# Patient Record
Sex: Male | Born: 2000 | Race: Black or African American | Hispanic: No | Marital: Single | Smoking: Never smoker
Health system: Southern US, Community
[De-identification: ages and names within clinical notes are randomized; demographics above are authoritative.]

## PROBLEM LIST (undated history)

## (undated) DIAGNOSIS — F88 Other disorders of psychological development: Secondary | ICD-10-CM

## (undated) DIAGNOSIS — F809 Developmental disorder of speech and language, unspecified: Secondary | ICD-10-CM

## (undated) DIAGNOSIS — J302 Other seasonal allergic rhinitis: Secondary | ICD-10-CM

## (undated) HISTORY — DX: Other disorders of psychological development: F88

## (undated) HISTORY — DX: Developmental disorder of speech and language, unspecified: F80.9

## (undated) HISTORY — DX: Other seasonal allergic rhinitis: J30.2

---
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
## 2022-11-18 ENCOUNTER — Emergency Department (HOSPITAL_COMMUNITY): Payer: No Typology Code available for payment source

## 2022-11-18 ENCOUNTER — Encounter (HOSPITAL_COMMUNITY): Payer: Self-pay | Admitting: *Deleted

## 2022-11-18 ENCOUNTER — Emergency Department (HOSPITAL_COMMUNITY)
Admission: EM | Admit: 2022-11-18 | Discharge: 2022-11-18 | Disposition: A | Discharge status: Home / Self Care | Payer: No Typology Code available for payment source | Attending: Emergency Medicine | Admitting: Emergency Medicine

## 2022-11-18 ENCOUNTER — Other Ambulatory Visit: Payer: Self-pay

## 2022-11-18 DIAGNOSIS — R451 Restlessness and agitation: Secondary | ICD-10-CM

## 2022-11-18 DIAGNOSIS — F4324 Adjustment disorder with disturbance of conduct: Secondary | ICD-10-CM | POA: Diagnosis not present

## 2022-11-18 DIAGNOSIS — R456 Violent behavior: Secondary | ICD-10-CM | POA: Diagnosis present

## 2022-11-18 LAB — COMPREHENSIVE METABOLIC PANEL
ALT: 23 U/L (ref 0–44)
AST: 25 U/L (ref 15–41)
Albumin: 4.5 g/dL (ref 3.5–5.0)
Alkaline Phosphatase: 48 U/L (ref 38–126)
Anion gap: 7 (ref 5–15)
BUN: 15 mg/dL (ref 6–20)
CO2: 27 mmol/L (ref 22–32)
Calcium: 9.1 mg/dL (ref 8.9–10.3)
Chloride: 104 mmol/L (ref 98–111)
Creatinine, Ser: 1.13 mg/dL (ref 0.61–1.24)
GFR, Estimated: >60 mL/min (ref >60)
Glucose, Bld: 115 mg/dL — ABNORMAL HIGH (ref 70–99)
Potassium: 3.2 mmol/L — ABNORMAL LOW (ref 3.5–5.1)
Sodium: 138 mmol/L (ref 135–145)
Total Bilirubin: 1.1 mg/dL (ref 0.3–1.2)
Total Protein: 7.5 g/dL (ref 6.5–8.1)

## 2022-11-18 LAB — ETHANOL: Alcohol, Ethyl (B): <10 mg/dL (ref <10)

## 2022-11-18 LAB — CBC WITH DIFFERENTIAL/PLATELET
Abs Immature Granulocytes: 0.02 10*3/uL (ref 0.00–0.07)
Basophils Absolute: 0 10*3/uL (ref 0.0–0.1)
Basophils Relative: 0 %
Eosinophils Absolute: 0 10*3/uL (ref 0.0–0.5)
Eosinophils Relative: 1 %
HCT: 45.3 % (ref 39.0–52.0)
Hemoglobin: 15.7 g/dL (ref 13.0–17.0)
Immature Granulocytes: 0 %
Lymphocytes Relative: 28 %
Lymphs Abs: 2.1 10*3/uL (ref 0.7–4.0)
MCH: 29.3 pg (ref 26.0–34.0)
MCHC: 34.7 g/dL (ref 30.0–36.0)
MCV: 84.7 fL (ref 80.0–100.0)
Monocytes Absolute: 0.6 10*3/uL (ref 0.1–1.0)
Monocytes Relative: 8 %
Neutro Abs: 4.7 10*3/uL (ref 1.7–7.7)
Neutrophils Relative %: 63 %
Platelets: 315 10*3/uL (ref 150–400)
RBC: 5.35 MIL/uL (ref 4.22–5.81)
RDW: 14.2 % (ref 11.5–15.5)
WBC: 7.5 10*3/uL (ref 4.0–10.5)
nRBC: 0 % (ref 0.0–0.2)

## 2022-11-18 LAB — RAPID URINE DRUG SCREEN, HOSP PERFORMED
Amphetamines: NOT DETECTED
Barbiturates: NOT DETECTED
Benzodiazepines: NOT DETECTED
Cocaine: NOT DETECTED
Opiates: NOT DETECTED
Tetrahydrocannabinol: NOT DETECTED

## 2022-11-18 MED ORDER — POTASSIUM CHLORIDE 20 MEQ PO PACK
40.0000 meq | PACK | Freq: Once | ORAL | Status: AC
  Administered 2022-11-18: 40 meq via ORAL
  Filled 2022-11-18: qty 2

## 2022-11-18 MED ORDER — RISPERIDONE 0.5 MG PO TABS: ORAL_TABLET | ORAL | 0 refills | Status: DC

## 2022-11-18 MED ORDER — RISPERIDONE 1 MG PO TABS
0.5000 mg | ORAL_TABLET | Freq: Every day | ORAL | Status: DC
  Filled 2022-11-18: qty 1

## 2022-11-18 MED ORDER — RISPERIDONE 1 MG PO TABS
0.5000 mg | ORAL_TABLET | Freq: Two times a day (BID) | ORAL | Status: DC | PRN
  Administered 2022-11-18: 0.5 mg via ORAL

## 2022-11-18 NOTE — ED Notes (Signed)
Per [facility] counselor, TTS is scheduled to be done around 8pm
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
## 2022-11-18 NOTE — ED Notes (Addendum)
Patient placed in hallway 7. Patient dressed out into appropriate hospital [facility] burgundy scrubs.   Patients belongings locked up in patient safety lockers. Patients belonging are placed in locker #7, second row, second locker on the bottom right.   Patients belongings consist of: basketball shorts, T-shirt, socks, and shoes. Patient given urine specimen cup and urine sample obtained at this time.   Patient is being very calm and cooperative at this time.    Nurse notified.

## 2022-11-18 NOTE — ED Notes (Signed)
Pt taken to ct 

## 2022-11-18 NOTE — BH Assessment (Signed)
This consult has been deferred to Cory Freeman. The consult is scheduled at 8:00 pm with Dr. Maryelizabeth Kaufmann.

## 2022-11-18 NOTE — ED Notes (Signed)
Meal given

## 2022-11-18 NOTE — ED Notes (Signed)
This RN called mom for pt's transport home. She states she will arrive in about 30 min

## 2022-11-18 NOTE — ED Notes (Signed)
Patient and mother verbalizes understanding of discharge instructions. Opportunity for questioning and answers were provided. Armband removed by staff, pt discharged from ED. Pt ambulated out to lobby with mother, legal guardian

## 2022-11-18 NOTE — ED Notes (Addendum)
See triage notes. Pt has some crayons coloring at this time. Polite and cooperative. Pt denies avh

## 2022-11-18 NOTE — ED Notes (Signed)
TTS being performed at this time 

## 2022-11-18 NOTE — Discharge Instructions (Addendum)
You were seen in the emergency department for increased aggressiveness and agitation.  You had a CAT scan of your head that did not show any acute findings.  Your lab work was also fairly unremarkable.  Psychiatry is recommending starting you on risperidone before bedtime and also twice a day as needed for agitation.  Follow-up with your psychiatry team.  Return to the emergency department if any worsening or concerning symptoms

## 2022-11-18 NOTE — ED Provider Notes (Signed)
Patient was seen and evaluated by psychiatry.  They have talked with family members and they feel he is psychiatrically cleared for discharge.  They would like him started on risperidone 0.5 mg nightly and also as needed for agitation.  He has follow-up next Wednesday.  They recommended giving him a supply of enough tablets to reach the psychiatrist.  They have talked with the patient's mother and grandmother and they are on board with plan for discharge.   Terrilee Files, MD 11/19/22 [phone]
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
## 2022-11-19 ENCOUNTER — Other Ambulatory Visit: Payer: Self-pay

## 2022-11-19 ENCOUNTER — Emergency Department (HOSPITAL_COMMUNITY)
Admission: EM | Admit: 2022-11-19 | Discharge: 2022-11-21 | Disposition: A | Discharge status: Other Acute Inpatient Hospital | Payer: Medicaid Other | Attending: Emergency Medicine | Admitting: Emergency Medicine

## 2022-11-19 ENCOUNTER — Encounter (HOSPITAL_COMMUNITY): Payer: Self-pay

## 2022-11-19 DIAGNOSIS — F29 Unspecified psychosis not due to a substance or known physiological condition: Secondary | ICD-10-CM | POA: Insufficient documentation

## 2022-11-19 DIAGNOSIS — R4189 Other symptoms and signs involving cognitive functions and awareness: Secondary | ICD-10-CM | POA: Diagnosis not present

## 2022-11-19 DIAGNOSIS — S61411A Laceration without foreign body of right hand, initial encounter: Secondary | ICD-10-CM | POA: Insufficient documentation

## 2022-11-19 DIAGNOSIS — W208XXA Other cause of strike by thrown, projected or falling object, initial encounter: Secondary | ICD-10-CM | POA: Diagnosis not present

## 2022-11-19 DIAGNOSIS — R451 Restlessness and agitation: Secondary | ICD-10-CM | POA: Insufficient documentation

## 2022-11-19 DIAGNOSIS — Z20822 Contact with and (suspected) exposure to covid-19: Secondary | ICD-10-CM | POA: Insufficient documentation

## 2022-11-19 DIAGNOSIS — Y92009 Unspecified place in unspecified non-institutional (private) residence as the place of occurrence of the external cause: Secondary | ICD-10-CM | POA: Insufficient documentation

## 2022-11-19 DIAGNOSIS — R456 Violent behavior: Secondary | ICD-10-CM | POA: Diagnosis not present

## 2022-11-19 DIAGNOSIS — S6991XA Unspecified injury of right wrist, hand and finger(s), initial encounter: Secondary | ICD-10-CM | POA: Diagnosis present

## 2022-11-19 DIAGNOSIS — R4689 Other symptoms and signs involving appearance and behavior: Secondary | ICD-10-CM

## 2022-11-19 DIAGNOSIS — F79 Unspecified intellectual disabilities: Secondary | ICD-10-CM

## 2022-11-19 DIAGNOSIS — Z23 Encounter for immunization: Secondary | ICD-10-CM | POA: Diagnosis not present

## 2022-11-19 MED ORDER — TETANUS-DIPHTH-ACELL PERTUSSIS 5-2.5-18.5 LF-MCG/0.5 IM SUSY
0.5000 mL | PREFILLED_SYRINGE | Freq: Once | INTRAMUSCULAR | Status: AC
  Administered 2022-11-19: 0.5 mL via INTRAMUSCULAR
  Filled 2022-11-19: qty 0.5

## 2022-11-19 MED ORDER — OLANZAPINE 5 MG PO TBDP
5.0000 mg | ORAL_TABLET | Freq: Every day | ORAL | Status: DC
  Administered 2022-11-19 – 2022-11-20 (×2): 5 mg via ORAL
  Filled 2022-11-19 (×2): qty 1

## 2022-11-19 MED ORDER — ZIPRASIDONE MESYLATE 20 MG IM SOLR: 20.0000 mg | INTRAMUSCULAR | Status: DC | PRN

## 2022-11-19 MED ORDER — OLANZAPINE 5 MG PO TBDP: 5.0000 mg | ORAL_TABLET | Freq: Three times a day (TID) | ORAL | Status: DC | PRN

## 2022-11-19 MED ORDER — LORAZEPAM 1 MG PO TABS
1.0000 mg | ORAL_TABLET | ORAL | Status: AC | PRN
  Administered 2022-11-19: 1 mg via ORAL
  Filled 2022-11-19: qty 1

## 2022-11-19 NOTE — ED Notes (Signed)
Patient to room 31. Patient ambulated to room.  Patient cooperative. Patient oriented to unit and room.  °

## 2022-11-19 NOTE — ED Notes (Signed)
Patient changed into burgandy scrubs and belongings put up.
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
## 2022-11-19 NOTE — ED Provider Notes (Signed)
[place] EMERGENCY DEPARTMENT AT [facility] Provider Note   CSN: [id number] Arrival date & time: 11/19/22  1339     History  Chief Complaint  Patient presents with   Aggressive Behavior    Brentley J Morrow is a 22 y.o. male.   Laceration  Patient is a 22 year old male with past medical history significant for developmental delay/intellectual delay of some kind.  Brought in by mother and aunt seems that he has been more aggressive over the past few days.  He was brought to [facility] for evaluation of this yesterday and had CT scan of head which was normal.  He was ultimately discharged however his agitation has become more severe.  Today he was apparently throwing things around the house and even cut his hand in the process.  His mother and aunt brought him to the emergency room because of his continued agitation.  They state that since he arrived in emergency room he has been quite more calm.    Home Medications Prior to Admission medications   Medication Sig Start Date End Date Taking? Authorizing Provider  Azelastine-Fluticasone (DYMISTA) 137-50 MCG/ACT SUSP Place into the nose.    [provider]  cetirizine (ZYRTEC) 10 MG tablet Take 10 mg by mouth daily.    [provider]  risperiDONE (RISPERDAL) 0.5 MG tablet Take 1 tablet (0.5 mg total) by mouth at bedtime. May also take 1 tablet (0.5 mg total) 2 (two) times daily as needed (aggitation). 11/18/22   Terrilee Files, MD      Allergies    Patient has no known allergies.    Review of Systems   Review of Systems  Physical Exam Updated Vital Signs BP (!) 134/91 (BP Location: Left Arm)   Pulse (!) 108   Temp 98.2 F (36.8 C) (Oral)   Resp 20   Ht 5' (1.524 m)   Wt 68.4 kg   SpO2 100%   BMI 29.45 kg/m  Physical Exam Vitals and nursing note reviewed.  Constitutional:      General: He is not in acute distress.    Appearance: Normal appearance. He is not ill-appearing.   HENT:     Head: Normocephalic and atraumatic.  Eyes:     General: No scleral icterus.       Right eye: No discharge.        Left eye: No discharge.     Conjunctiva/sclera: Conjunctivae normal.  Pulmonary:     Effort: Pulmonary effort is normal.     Breath sounds: No stridor.  Skin:    Comments: Small superficial skin laceration, laceration skin edges are approximated  Neurological:     Mental Status: He is alert and oriented to person, place, and time. Mental status is at baseline.  Psychiatric:        Mood and Affect: Mood normal.        Behavior: Behavior normal.     ED Results / Procedures / Treatments   Labs (all labs ordered are listed, but only abnormal results are displayed) Labs Reviewed - No data to display  EKG None  Radiology CT Head Wo Contrast  Result Date: 11/18/2022 CLINICAL DATA:  Mental status change, unknown cause EXAM: CT HEAD WITHOUT CONTRAST TECHNIQUE: Contiguous axial images were obtained from the base of the skull through the vertex without intravenous contrast. RADIATION DOSE REDUCTION: This exam was performed according to the departmental dose-optimization program which includes automated exposure control, adjustment of the mA and/or kV  according to patient size and/or use of iterative reconstruction technique. COMPARISON:  None Available. FINDINGS: Brain: No evidence of acute infarction, hemorrhage, hydrocephalus, extra-axial collection or mass lesion/mass effect. Sequela of mild chronic microvascular ischemic change. Vascular: No hyperdense vessel or unexpected calcification. Skull: Normal. Negative for fracture or focal lesion. Sinuses/Orbits: No middle ear or mastoid effusion. Paranasal sinuses are clear. Orbits are unremarkable. Other: None. IMPRESSION: No acute intracranial abnormality. No CT finding to explain altered mental status. Electronically Signed   By: Lorenza Cambridge M.D.   On: 11/18/2022 17:44    Procedures Procedures    Medications  Ordered in ED Medications  OLANZapine zydis (ZYPREXA) disintegrating tablet 5 mg (has no administration in time range)    And  LORazepam (ATIVAN) tablet 1 mg (1 mg Oral Given 11/19/22 1723)    And  ziprasidone (GEODON) injection 20 mg (has no administration in time range)  OLANZapine zydis (ZYPREXA) disintegrating tablet 5 mg (has no administration in time range)  Tdap (BOOSTRIX) injection 0.5 mL (0.5 mLs Intramuscular Given 11/19/22 1834)    ED Course/ Medical Decision Making/ A&P                             Medical Decision Making Risk Prescription drug management.   Patient is a 22 year old male with past medical history significant for developmental delay/intellectual delay of some kind.  Brought in by mother and aunt seems that he has been more aggressive over the past few days.  He was brought to [facility] for evaluation of this yesterday and had CT scan of head which was normal.  He was ultimately discharged however his agitation has become more severe.  Today he was apparently throwing things around the house and even cut his hand in the process.  His mother and aunt brought him to the emergency room because of his continued agitation.  They state that since he arrived in emergency room he has been quite more calm.  PE unremarkable, laceration does not require sutures.  Updated on tetanus.  Patient had extensive workup done yesterday.  No indication for repeat labs.  Awaiting TTS consultation.  Patient is voluntary and here with mother and aunt.  Final Clinical Impression(s) / ED Diagnoses Final diagnoses:  Aggressive behavior    Rx / DC Orders ED Discharge Orders     None         Gailen Shelter, [place] 11/19/22 1904    Maia Plan, MD 11/24/22 [phone]
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
## 2022-11-20 LAB — SARS CORONAVIRUS 2 BY RT PCR: SARS Coronavirus 2 by RT PCR: NEGATIVE

## 2022-11-20 NOTE — ED Notes (Signed)
Mother into visit , stated visit went well. Shivam affect brighter mood stable behavior self controlled and interact well with state.

## 2022-11-20 NOTE — ED Provider Notes (Signed)
Emergency Medicine Observation Re-evaluation Note  Cory Freeman is a 22 y.o. male, seen on rounds today.  Pt initially presented to the ED for complaints of Aggressive Behavior Currently, the patient is asleep.  Physical Exam  BP 119/85 (BP Location: Right Arm)   Pulse 65   Temp (!) 97.5 F (36.4 C) (Oral)   Resp 15   Ht 5' (1.524 m)   Wt 68.4 kg   SpO2 100%   BMI 29.45 kg/m  Physical Exam General: No distress Lungs: No respiratory distress Psych: Currently calm  ED Course / MDM  EKG:   I have reviewed the labs performed to date as well as medications administered while in observation.  Recent changes in the last 24 hours include -patient was seen by psychiatry service.  Given the violent behavior, psychosis type behavior, he is deemed a candidate for inpatient care.  Plan  Current plan is for holding patient in the emergency room for psychiatric admission.  Anticipate acceptance to [facility].     Cory Kaplan, MD 11/20/22 1027

## 2022-11-20 NOTE — Progress Notes (Signed)
3:36 PM - CSW received phone call from [facility] intake staff regarding pt's acceptance. After further reassessment, [facility] has rescinded bed offer due to pt's aggressive behaviors and development delays. CSW will continue to assist and follow with placement.  Cathie Beams, [place]  11/20/2022 3:40 PM

## 2022-11-20 NOTE — ED Notes (Signed)
Currently awake after having vital signs completed. Behavior is currently self controlled  and affect is flat mood is stable.

## 2022-11-20 NOTE — Progress Notes (Addendum)
Pt was accepted to [facility] TODAY 11/20/2022; Bed Assignment 3 east PENDING Negative COVID faxed to [phone]  Pt meets inpatient criteria per Alona Bene, PMHNP  Attending Physician will be AndrewVentersOwensDO  Report can be called to: - [phone]  Pt can arrive after 9:00am  Care Team notified:[facility], LCSWA 11/20/2022 @ 12:52 AM

## 2022-11-20 NOTE — ED Notes (Signed)
[facility] notified for pt transport- stated it may be tomorrow before they can take pt.

## 2022-11-20 NOTE — ED Notes (Signed)
Pt was accepted to [facility] TODAY 11/20/2022; Bed Assignment 3 east PENDING IVC paperwork and Negative COVID faxed to [phone]  Pt meets inpatient criteria per Alona Bene, PMHNP  Attending Physician will be AndrewVentersOwensDO  Report can be called to: - [phone]  Pt can arrive after 9:00am

## 2022-11-20 NOTE — ED Notes (Signed)
Covid result and IVC paperwork faxed to [facility]

## 2022-11-20 NOTE — Progress Notes (Signed)
LCSW Progress Note  [id number]   Cory Freeman  11/20/2022  12:31 AM    Inpatient Behavioral Health Placement  Pt meets inpatient criteria per Alona Bene, PMHNP. There are no available beds within [facility] system per [facility] Kenisha Herbin,RN. Referral was sent to the following facilities;   Destination  Service Provider Address Phone Fax  [facility]  [address], [place] [address] [phone]  [facility] [address]  [address], [place] [address] [phone]  [facility] [address] [place]  [address] [place] [address] [phone]  [facility]  [phone] [address] [place] [address] [phone]  [facility]  [address] [place], [place] [address] [phone]  [facility]  [address] [place] [address] [phone]  [facility]  [address] [place] [address] [phone]  [facility]  [address] [place] [address] [phone]  [facility]  [address] [place] [address] [phone]  [facility]  [address] [place] [address] [phone]  [facility]  [address] [place] [address] [phone]  [facility]  [address] [place] [address] [phone]  [facility]  [address] [place] [address] [phone]  [facility]  [address] [place] [address] [phone]  [facility]  [address], [place] [address] [phone]  [facility]  [address], [place] [address] [phone]  [facility]  [address] [place], [place] [address] [phone]  [facility]  [address] [place] [address] [phone]  [facility]  [address] [place] [address] [phone]  [facility]  [address], [place] [address] [phone]  [facility]  [address] [place] [address] [phone]  [facility]  [address] [place], [place] [address] [phone]  [facility]  [address] [place], [place] [address] [phone]  [facility] [address] [place] [address] [phone]  [facility]  [address] [place] [address] [phone]  [facility]  [address] [place] [address] [phone]    Situation ongoing,  CSW will follow up.    Maryjean Ka, MSW, [facility] 11/20/2022 12:31 AM

## 2022-11-20 NOTE — ED Notes (Signed)
Sleeping  with no distress or sleep disturbance noted respirations are easy skin color is WNL for pt ethnicity.

## 2022-11-21 NOTE — ED Notes (Addendum)
Call from [place] from [facility] and pt information reviewed for possible acceptance and admission. Cory Freeman is currently sleeping no signs of distress or difficulty.

## 2022-11-21 NOTE — ED Notes (Signed)
Called main number for [facility] [phone] to give report. Admission answered, stated [phone] is only for Sundays. Monday through Saturday 8pm - 4pm call [phone].

## 2022-11-21 NOTE — BH Assessment (Addendum)
Final Disposition:  [facility]   Pt meets inpatient criteria per [facility] provider [place], Ezra Sites, PMHNP    Attending Physician at [facility] will be Riley Churches, MD   Report can be called to: [phone]   Pt can arrive today, 11/21/2022, bed is ready.   Care Team notified: Donzetta Kohut, MD, Geralynn Ochs, NP, and Melody, RN

## 2022-11-21 NOTE — ED Notes (Signed)
Writer called [phone], option 2 to leave message . Left message requesting return call.

## 2022-11-21 NOTE — ED Notes (Signed)
Attempted to call report again [phone] . No answer. Voicemail picked up.

## 2022-11-21 NOTE — ED Notes (Signed)
Writer called patient's mother [name] to notify patient will be transported [facility]. [name] verbalized agreement. Writer provided mother with contact information to [facility]. [name] thanked staff for all of our help. Writer then spoke with patient. Patient is sitting up in bed , eating breakfast. Friendly and cooperative demeanor at this time. Patient shook writers hand and introduced himself. Writer explained that [facility] will transport patient to [facility] and assured patient that he is not in trouble. Patient aware sheriff will transport and verbalizes no concerns with this process.

## 2022-11-21 NOTE — ED Provider Notes (Signed)
Emergency Medicine Observation Re-evaluation Note  Cory Freeman is a 22 y.o. male, seen on rounds today.  Pt initially presented to the ED for complaints of Aggressive Behavior Currently, the patient is resting.  Physical Exam  BP 117/75 (BP Location: Left Arm)   Pulse 65   Temp 97.6 F (36.4 C) (Oral)   Resp 18   Ht 5' (1.524 m)   Wt 68.4 kg   SpO2 100%   BMI 29.45 kg/m  Physical Exam General: No distress Cardiac: Regular rate Lungs: No respiratory distress Psych: Calm  ED Course / MDM  EKG:   I have reviewed the labs performed to date as well as medications administered while in observation.  Recent changes in the last 24 hours include -no new changes.  [facility].  Plan  Current plan is for d.c to [facility].    Derwood Kaplan, MD 11/21/22 1041

## 2022-11-21 NOTE — Progress Notes (Signed)
LCSW Progress Note  [id number]   Dartanian J Brearley  11/21/2022  1:53 AM    Inpatient Behavioral Health Placement  Pt meets inpatient criteria per Alona Bene, PMHNP. Referral was sent to the following facilities;    Destination  Service Provider Address Phone Fax  [facility]  [address] [place] [address] [phone]  [facility] [address]  [address] [place] [address] [phone]  [facility] [address] [place]  [address] [place] [address] [phone]  [facility]  [phone] [place] [address] [phone]  [facility]  [address] [place], [place] [address] [phone]  [facility]  [address] [place] [address] [phone]  [facility]  [address] [place] [address] [phone]  [facility]  [address], [place] [address] [phone]  [facility]  [address] [place] [address] [phone]  [facility]  [address], [place] [address] [phone]  [facility]  [address], [place] [address] [phone]  [facility]  [address] [place] [address] [phone]  [facility]  [address], [place] [address] [phone]  [facility]  [address] [place] [address] [phone]  [facility]  [address] [place] [address] [phone]  [facility]  [address], [place] [address] [phone]  [facility]  [address] [place], [place] [address] [phone]  [facility]  [address], [place] [address] [phone]  [facility]  [address] medical [place] [address] [phone]  [facility]  [address], [place] [address] [phone]  [facility]  [address] [place] [address] [phone]  [facility]  [address] [place], [place] [address] [phone]  [facility]  [address] [place], [place] [address] [phone]  [facility] [address] [place] [address] [phone]  [facility]  [address] [place] [address] [phone]  [facility]  [address] [place] [address] [phone]  [facility]  [address] [place] [address] [phone]  [facility]  [address] [place] [address] [phone]  [facility]  [address] [place] [address] [phone]  [facility]  [address] [place] [address] [phone]   Situation ongoing,  CSW will follow up.    Maryjean Ka, MSW, LCSWA 11/21/2022 1:53 AM

## 2022-11-21 NOTE — ED Notes (Signed)
Received call from [facility]. This nurse gave report to Daleen Snook RN from [facility] at this time.

## 2022-11-21 NOTE — ED Notes (Signed)
[facility] arrived to [facility] patient to [facility]

## 2022-11-21 NOTE — ED Notes (Signed)
Writer called [place] [phone] to give report. Call went straight to voicemail. Writer attempted to call twice. Left voicemail identifying self and requesting return call.

## 2022-11-21 NOTE — ED Notes (Signed)
Writer contacted [employer] transport to request patient be transported to [facility]. Writer had to leave voice mail.

## 2022-11-21 NOTE — ED Notes (Signed)
Nurse spoke with sheriff transport. They report they are in [place] right now and will transport patient when done with that task.

## 2022-11-21 NOTE — ED Notes (Signed)
Pt is sleeping with no signs of distress or difficulty respiration appear easy.

## 2023-02-08 ENCOUNTER — Encounter (HOSPITAL_COMMUNITY): Payer: Self-pay | Admitting: Registered Nurse

## 2023-02-08 ENCOUNTER — Ambulatory Visit (HOSPITAL_COMMUNITY)
Admission: EM | Admit: 2023-02-08 | Discharge: 2023-02-08 | Disposition: A | Discharge status: Home / Self Care | Payer: MEDICAID | Attending: Registered Nurse | Admitting: Registered Nurse

## 2023-02-08 DIAGNOSIS — F6381 Intermittent explosive disorder: Secondary | ICD-10-CM | POA: Insufficient documentation

## 2023-02-08 DIAGNOSIS — R4689 Other symptoms and signs involving appearance and behavior: Secondary | ICD-10-CM | POA: Diagnosis present

## 2023-02-08 DIAGNOSIS — F79 Unspecified intellectual disabilities: Secondary | ICD-10-CM | POA: Insufficient documentation

## 2023-02-08 MED ORDER — OLANZAPINE 5 MG PO TABS: 5.0000 mg | ORAL_TABLET | Freq: Two times a day (BID) | ORAL | 2 refills | Status: DC

## 2023-02-08 MED ORDER — HYDROXYZINE PAMOATE 25 MG PO CAPS: 25.0000 mg | ORAL_CAPSULE | Freq: Three times a day (TID) | ORAL | 0 refills | Status: DC | PRN

## 2023-02-08 NOTE — ED Provider Notes (Signed)
Behavioral Health Urgent Care Medical Screening Exam  Patient Name: Cory Freeman MRN: [id number] Date of Evaluation: 02/08/23 Chief Complaint:   Diagnosis:  Final diagnoses:  Intellectual disability  Outbursts of explosive behavior    History of Present illness: Cory Freeman is a 22 y.o. male patient presented to [facility] as a walk in accompanied by his mother and grandmother with complaints of behavioral outburst and aggressive behavior  [name], 22 y.o., male patient seen face to face by this provider, chart reviewed, and consulted with Dr. Nelly Rout on 02/08/23.  On evaluation Cory Freeman is not a good historian.  He does admit to breaking things in his room because he was mad and didn't like his room because it doesn't have pictures of his girlfriend in it.  Grandmother reports a history of intellectual disability and unsure of IQ.  Reports that patient has a girlfriend Melody Haver who is a wrestler.  States that he had pictures in his room at one time but they were removed a long time a go related to patient wouldn't leave room.  States that over the last several months patients aggressive and behavioral outburst have increased and "can happen at any time.  Like this morning it just came out the blue.  Throwing things, breaking anything he picked up for no reason at all."  Mother and grandmother states patient has outpatient psychiatric services at [facility] but doesn't feel the medication is working.  States that patient needs a medication change.  Mother states that patient is getting to big and she is getting to the point where she is afraid of patient being at home.  States that patient stay with his grandmother during the day while she is at work and with her when she gets off work.  Both are asking about group home placement.  Report patient has never had any services other than when he was admitted to [facility] in June 2024  Grandmother states  "They kept him for 5 days but I don't see where it did nothing for him."  Mother states that they have Medicaid care coordinator "She said there was a list of thing that had to be done.  He need to have some test."  States she was contacted by [employer] today and "Told me to call them tomorrow after he was seen here.  I think they can get him in for the testing sooner than anyone else.   During evaluation Cory Freeman is seated in exam room between his mother and grandmother. There is no noted distress.  He is alert/oriented to self and the reason he was brought to [facility].  He was unable to tell his age.  He remained calm and cooperative throughout assessment.  His responses were liner but appropriate to assessment questions.  He spoke in a clear tone at moderate volume, delayed pace with speech impairment.  He denies suicidal/self-harm/homicidal ideation, psychosis, and paranoia.  Objectively there is no evidence of psychosis/mania or delusional thinking.  There was no distractibility, or pre-occupation noted.     At this time Cory Freeman' mother and grandmother are educated and verbalizes understanding of mental health resources and other crisis services in the community. They are instructed to call 911 and present to the nearest emergency room should he experience any suicidal/homicidal ideation, auditory/visual/hallucinations, or detrimental worsening of his mental health condition.  They were also informed to contact his Medicaid care coordinator for  other resources.    Flowsheet Row ED from 02/08/2023 in [facility] ED from 11/19/2022 in [facility] at [facility] ED from 11/18/2022 in [facility] Emergency Department at [facility]  C-SSRS RISK CATEGORY No Risk No Risk Low Risk       Psychiatric Specialty Exam  Presentation  General Appearance:Appropriate for Environment; Casual  Eye  Contact:Fleeting  Speech:Slow (Speach impairment)  Speech Volume:Normal  Handedness:Right   Mood and Affect  Mood: Euthymic  Affect: Appropriate   Thought Process  Thought Processes: Linear (slowed)  Descriptions of Associations:-- (Unable to determine.)  Orientation:Partial (oriented to self and reason why he was brought to [facility])  Thought Content:Other (comment) (Unable to determine.)    Hallucinations:None  Ideas of Reference:None  Suicidal Thoughts:No  Homicidal Thoughts:No   Sensorium  Memory: Immediate Poor; Recent Poor; Remote Poor  Judgment: Impaired  Insight: Lacking; Poor   Executive Functions  Concentration: Poor  Attention Span: Poor  Recall: Poor  Fund of Knowledge: Poor  Language: Poor   Psychomotor Activity  Psychomotor Activity: Normal   Assets  Assets: Manufacturing systems engineer; Social Support; Physical Health; Leisure Time   Sleep  Sleep: Fair  Number of hours:  0 (per collateral from grandmother, he may have 4-6 hours of broken sleep)   Physical Exam: Physical Exam Vitals and nursing note reviewed. Exam conducted with a chaperone present.  Constitutional:      General: He is not in acute distress.    Appearance: Normal appearance. He is not ill-appearing.  HENT:     Head: Normocephalic.  Eyes:     Conjunctiva/sclera: Conjunctivae normal.  Cardiovascular:     Rate and Rhythm: Normal rate.  Pulmonary:     Effort: Pulmonary effort is normal. No respiratory distress.  Musculoskeletal:        General: Normal range of motion.     Cervical back: Normal range of motion.  Skin:    General: Skin is warm and dry.  Neurological:     Mental Status: He is alert and oriented to person, place, and time.  Psychiatric:        Attention and Perception: He does not perceive auditory or visual hallucinations.        Mood and Affect: Mood normal.        Behavior: Behavior normal. Behavior is not agitated. Behavior is  cooperative.        Thought Content: Thought content normal. Thought content is not paranoid or delusional. Thought content does not include homicidal or suicidal ideation.        Judgment: Judgment is impulsive.     Comments: Speech impairment, intellectual disorder    Review of Systems  Constitutional:        No other complaints voiced  Psychiatric/Behavioral:  Depression: Denies. Hallucinations: denies. Substance abuse: Denies. Suicidal ideas: Denies. Nervous/anxious: Denies.        Mother and grandmother primary historians.  Patient stated he did not like his room because don't have pictures of girlfriend in it.  Family states fictional girlfriend Melody Haver  All other systems reviewed and are negative.  Blood pressure 134/87, pulse 82, temperature 97.6 F (36.4 C), temperature source Oral, resp. rate 19, SpO2 100%. There is no height or weight on file to calculate BMI.  Musculoskeletal: Strength & Muscle Tone: within normal limits Gait & Station: normal Patient leans: N/A   [facility] NSE Discharge Disposition for Follow up and Recommendations: Based on my evaluation  the patient does not appear to have an emergency medical condition and can be discharged with resources and follow up care in outpatient services for Medication Management and Individual Therapy   Maysun Meditz, NP 02/08/2023, 7:00 PM

## 2023-02-08 NOTE — Discharge Instructions (Addendum)
Aggressive behavior is a common dilemma for individual with developmental disabilities causing an increased risk of institutionalization, social isolation, physical restraint, refusal of services, becoming victims of abuse, increased stress on those caring for them, and overuse of medications or polypharmacy to treat their behavioral problems. "Diagnosis and treatment of aggression in individuals with developmental disabilities" "Diagnosis and treatment of aggression in individuals with developmental disabilities" doi: 10.1007/[phone]. Epub 2008 Aug 23. The Aggression has become learned behavior, and the individual has learned that the aggression towards others will achieve a desired outcome and become functionally related to consequences.  The desired outcome of behavior is usually to gain attention, access to tangible reinforcers, escape or avoid an unpleasant situation or demand or achieve multiple desirable outcomes.   "A review of behavioral interventions for the treatment of aggression in individuals with developmental disabilities" 2011.  [email or web address]   Talk with your Medicaid care coordinator about group home placement and day programs that are offered in [facility]  Address:  [address] [place], [place] [address] Hours: Mon-Fri 8AM-5PM Phone: [phone] Fax: [phone]   Welcome to our [facility] facility located [place]. Please take a moment to familiarize yourself with the numerous mental health and substance abuse services available at this facility. If you're a walk-in patient there is generally a wait time involved on a "first come, first served basis" otherwise contact us beforehand to setup an appointment. If you're in crisis, please use our 24-Hour Crisis Hotline for immediate access to a clinician. If this is your first time, please bring the following with you: Insurance/Medicaid Card ID or Social Security Card Any  referral documentation  Payment Options Individuals with Medicaid have no obligation for a copay. Individuals with Medicare or private insurance will be obligated to meet their policy's requirement(s). Individuals who are uninsured will be eligible for a sliding or discounted scaled as defined by the relevant Managed Care Organization/Local Management Entity  What we do here [facility] is a non-profit organization established to provide comprehensive behavioral healthcare services as defined by those in the community in need of mental health or substance abuse treatment options. All of our staff are highly qualified individuals with a plethora of knowledge to suit individual care as needed. Each of our facilities has a core set of of service options available, however; some centers offer various group meetings or community outreach programs according to the service area. Please contact the center for schedules or further information as these programs and meetings vary and often change depending upon attendance and counselor availability.  Services Adult Services Advanced Access Walk-In Assessments - All Disabilities Routine Outpatient Therapy Psychiatry/Med Management Substance Abuse Intensive Outpatient Scientist, research (medical)) Mobile Crisis Assertive Community Treatment Team (ACTT) Jail Services Medication Assisted Treatment - MAT (Suboxone) Tailored Care Management (TCM) Youth Services Advanced Access Walk-In Assessments - All Disabilities Mobile Crisis Tailored Care Management (TCM)      [facility]     (ph) [phone]  (fax) [phone]     [address]  [place], [place] [address]  Insurance:   [facility] Direct      - [facility] is contracted with [employer],         and [facility]     *[facility] is eligible to complete a Oncologist for other [employer] managed care organizations [facility]).    [facility]  [employer]    [place] PHP's:      - [employer]  [facility] [facility] [employer]      - [employer]      - [employer]      - [employer] [facility]      - [place] [facility]  Private Insurances:      - Blue Cross Blue Shield  Medication Management Medication Management services are available at all of our locations. We provide services to children, adolescents, and adults.  A prescribing Medical Professional provides our Medication Management services.  Persons requesting medication management are scheduled to see a prescribing Medical Professional in the first time slot available.   During the initial appointment, the prescribing Medical Professional completes an evaluation to determine if medication is necessary. The evaluation will include questions such as:  review of any current and past medications, history of substance abuse, dietary needs and restrictions, past adverse reactions to medication or side effects, allergies, or other pertinent information. If he/she feels that medication is an appropriate course of treatment, a prescription is given, and a follow-up appointment is scheduled.  If we are unable to provide services as requested, a referral will be given to another community-based provider in the area.     [facility]  Address:  [address] [place], [place]  [address] Phone: [phone]  Our experienced team of mental health professionals collaborate to understand your unique needs and to provide treatments to restore your well-being. Outpatient services available include: Individual therapy Group therapy Family and couples therapy Children's therapy Dialectical and behavioral therapy group Veterans' therapy Life skills education Medication management
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
## 2023-02-15 ENCOUNTER — Encounter (HOSPITAL_COMMUNITY): Payer: Self-pay

## 2023-02-15 ENCOUNTER — Other Ambulatory Visit: Payer: Self-pay

## 2023-02-15 ENCOUNTER — Emergency Department (HOSPITAL_COMMUNITY)
Admission: EM | Admit: 2023-02-15 | Discharge: 2023-02-18 | Disposition: A | Discharge status: Home / Self Care | Payer: MEDICAID | Attending: Emergency Medicine | Admitting: Emergency Medicine

## 2023-02-15 DIAGNOSIS — F6381 Intermittent explosive disorder: Secondary | ICD-10-CM | POA: Insufficient documentation

## 2023-02-15 DIAGNOSIS — R4689 Other symptoms and signs involving appearance and behavior: Secondary | ICD-10-CM | POA: Diagnosis present

## 2023-02-15 DIAGNOSIS — Z79899 Other long term (current) drug therapy: Secondary | ICD-10-CM | POA: Diagnosis not present

## 2023-02-15 DIAGNOSIS — R456 Violent behavior: Secondary | ICD-10-CM | POA: Insufficient documentation

## 2023-02-15 LAB — RAPID URINE DRUG SCREEN, HOSP PERFORMED
Amphetamines: NOT DETECTED
Barbiturates: NOT DETECTED
Benzodiazepines: NOT DETECTED
Cocaine: NOT DETECTED
Opiates: NOT DETECTED
Tetrahydrocannabinol: NOT DETECTED

## 2023-02-15 LAB — COMPREHENSIVE METABOLIC PANEL
ALT: 34 U/L (ref 0–44)
AST: 34 U/L (ref 15–41)
Albumin: 4.2 g/dL (ref 3.5–5.0)
Alkaline Phosphatase: 39 U/L (ref 38–126)
Anion gap: 10 (ref 5–15)
BUN: 9 mg/dL (ref 6–20)
CO2: 24 mmol/L (ref 22–32)
Calcium: 9 mg/dL (ref 8.9–10.3)
Chloride: 105 mmol/L (ref 98–111)
Creatinine, Ser: 1.08 mg/dL (ref 0.61–1.24)
GFR, Estimated: >60 mL/min (ref >60)
Glucose, Bld: 113 mg/dL — ABNORMAL HIGH (ref 70–99)
Potassium: 3.9 mmol/L (ref 3.5–5.1)
Sodium: 139 mmol/L (ref 135–145)
Total Bilirubin: 0.8 mg/dL (ref 0.3–1.2)
Total Protein: 6.9 g/dL (ref 6.5–8.1)

## 2023-02-15 LAB — CBC WITH DIFFERENTIAL/PLATELET
Abs Immature Granulocytes: 0.02 10*3/uL (ref 0.00–0.07)
Basophils Absolute: 0 10*3/uL (ref 0.0–0.1)
Basophils Relative: 1 %
Eosinophils Absolute: 0.2 10*3/uL (ref 0.0–0.5)
Eosinophils Relative: 4 %
HCT: 44.5 % (ref 39.0–52.0)
Hemoglobin: 15.3 g/dL (ref 13.0–17.0)
Immature Granulocytes: 0 %
Lymphocytes Relative: 56 %
Lymphs Abs: 3 10*3/uL (ref 0.7–4.0)
MCH: 29.2 pg (ref 26.0–34.0)
MCHC: 34.4 g/dL (ref 30.0–36.0)
MCV: 84.9 fL (ref 80.0–100.0)
Monocytes Absolute: 0.4 10*3/uL (ref 0.1–1.0)
Monocytes Relative: 7 %
Neutro Abs: 1.7 10*3/uL (ref 1.7–7.7)
Neutrophils Relative %: 32 %
Platelets: 292 10*3/uL (ref 150–400)
RBC: 5.24 MIL/uL (ref 4.22–5.81)
RDW: 14.4 % (ref 11.5–15.5)
WBC: 5.3 10*3/uL (ref 4.0–10.5)
nRBC: 0 % (ref 0.0–0.2)

## 2023-02-15 LAB — ETHANOL: Alcohol, Ethyl (B): <10 mg/dL (ref <10)

## 2023-02-15 MED ORDER — HYDROXYZINE HCL 25 MG PO TABS
25.0000 mg | ORAL_TABLET | Freq: Three times a day (TID) | ORAL | Status: DC | PRN
  Administered 2023-02-18: 25 mg via ORAL
  Filled 2023-02-15: qty 1

## 2023-02-15 MED ORDER — OLANZAPINE 5 MG PO TBDP
5.0000 mg | ORAL_TABLET | Freq: Two times a day (BID) | ORAL | Status: DC
  Administered 2023-02-15 – 2023-02-18 (×6): 5 mg via ORAL
  Filled 2023-02-15 (×6): qty 1

## 2023-02-15 MED ORDER — DIVALPROEX SODIUM 250 MG PO DR TAB
250.0000 mg | DELAYED_RELEASE_TABLET | Freq: Two times a day (BID) | ORAL | Status: DC
  Administered 2023-02-15 – 2023-02-18 (×6): 250 mg via ORAL
  Filled 2023-02-15 (×6): qty 1
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
## 2023-02-15 NOTE — ED Provider Triage Note (Signed)
Emergency Medicine Provider Triage Evaluation Note  Cory Freeman , a 22 y.o. male  was evaluated in triage.  Mom, guardian, states patient was violent in June, patient gets very angry at times. Diagnosed with schizophrenia and bipolar disorder, on medications which dont seem to be helping. Mom was at work today, patient was mowing the yard and went inside and broke a clock and a TV. Patient has a girlfriend Cory Freeman on his tablet and he wants to get to her. Has destroyed 6-10 TVs since June. Family states they are scared because of his rage. Meds managed by [facility]. Has been to [facility] and [facility], family would like him admitted to [facility].  Here with mom and great aunt.  Review of Systems  Positive:  Negative:   Physical Exam  BP (!) 146/96 Comment: notifying nurse  Pulse (!) 113   Temp 98.3 F (36.8 C) (Oral)   Resp 16   Ht 5' (1.524 m)   Wt 103.1 kg   SpO2 96%   BMI 44.41 kg/m  Gen:   Awake, no distress   Resp:  Normal effort  MSK:   Moves extremities without difficulty  Other:    Medical Decision Making  Medically screening exam initiated at 5:45 PM.  Appropriate orders placed.  Cory Freeman was informed that the remainder of the evaluation will be completed by another provider, this initial triage assessment does not replace that evaluation, and the importance of remaining in the ED until their evaluation is complete.     Jeannie Fend, PA-C 02/15/23 1749

## 2023-02-15 NOTE — Consult Note (Signed)
[facility] ED ASSESSMENT   Reason for Consult:  Psychiatry evaluation Referring Physician:  ER Physician Patient Identification: Cory Freeman MRN:  [id number] ED Chief Complaint: Aggression  Diagnosis:  Principal Problem:   Aggression Active Problems:   Outbursts of explosive behavior   ED Assessment Time Calculation: Start Time: 1909 Stop Time: 1945 Total Time in Minutes (Assessment Completion): 36   Subjective:   Cory Freeman is a 22 y.o. male patient admitted with hx of intellectual disability, aggression and   outburst of explosive anger was brought in to the ER by his mother and grandmother this evening for explosive anger and gression where he tore up wall clock and broke TV.  HPI:  Patient was seen cal sitting in the room.  He does not know the reason for his visit to the ER.  He also denied ant confrontation or altercation that led to his visit to the ER.  He states that he likes coming to the ER.   Mother, Virgilio Belling was called into the room for collateral information.  She states that they are terrified of her son's anger outburst.  She states that in six months they have changed six TV in the house.  She states that her son tears the house down destroying properties including wall clocks.  She replaces items and he destroys  them within a day or two .  Mother is afraid of taking him home.  He states that the anger outburst is sudden  and very scary.  He was taken to [facility]th after an anger episode and was discharged home on Olanzapine.  Mother reports that he was hospitalized not long ago at [facility] in [place].  She believes that medication given Lybalvi was not effective.  She reports that patient completed High school and is home all day watching TV.  She planned to send him to day program but due to the anger she does not believe that patient can function there.  She is not sure of her son's IQ at this time.  She want patient to be kept under IVC until she can figure out what to do.   Mother was told that er is not a place tom stay for placement.  Mother was informed that we will make medications changes and as soon as patient tolerates new medication she will be sent back home with resources for placement. Patient is calm and cooperative.  He is well groomed and nourished.  He reports good sleep and appetite and states he does not know when he destroys properties.  Grandmother is scared of him in the house. We will start Depakote for mood stabilization and continue with Olanzapine bid.  Future plan will be to offer long acting injectable Medications if patient becomes noncompliant  with Medications. We will reevaluate in am.  Past Psychiatric History: hx of intellectual disability, aggression and   outburst of explosive anger. Marland Kitchen  Hospitalized at [facility] in [place] In June.  WAS AT [facility] September 16 th.    Risk to Self or Others: Is the patient at risk to self? No Has the patient been a risk to self in the past 6 months? No Has the patient been a risk to self within the distant past? No Is the patient a risk to others? No Has the patient been a risk to others in the past 6 months? No Has the patient been a risk to others within the distant past? No  [place] Scale:  [facility] ED  from 02/15/2023 in [facility] Emergency Department at [facility] ED from 02/08/2023 in [facility] ED from 11/19/2022 in [facility] Emergency Department at [facility]  C-SSRS RISK CATEGORY No Risk No Risk No Risk       AIMS:  , , ,  ,   ASAM:    Substance Abuse:     Past Medical History:  Past Medical History:  Diagnosis Date   Global developmental delay    Seasonal allergies    Speech/language delay    History reviewed. No pertinent surgical history. Family History:  Family History  Problem Relation Age of Onset   Hypertension Mother    Obesity Mother    Anemia Mother    Diabetes Mother    Diabetes Maternal Grandmother     Hypertension Maternal Grandmother    Family Psychiatric  History: Denied by mom Social History:  Social History   Substance and Sexual Activity  Alcohol Use No     Social History   Substance and Sexual Activity  Drug Use No    Social History   Socioeconomic History   Marital status: Single    Spouse name: Not on file   Number of children: Not on file   Years of education: Not on file   Highest education level: Not on file  Occupational History   Not on file  Tobacco Use   Smoking status: Never   Smokeless tobacco: Never  Substance and Sexual Activity   Alcohol use: No   Drug use: No   Sexual activity: Not on file  Other Topics Concern   Not on file  Social History Narrative   Is in 7th grade at [employer], special education. Receives speech and occupational therapy 3 days per week.    Lives with mom and grandma   Social Determinants of Health   Financial Resource Strain: Not on file  Food Insecurity: Not on file  Transportation Needs: Not on file  Physical Activity: Not on file  Stress: Not on file  Social Connections: Not on file   Additional Social History:    Allergies:  No Known Allergies  Labs:  Results for orders placed or performed during the hospital encounter of 02/15/23 (from the past 48 hour(s))  Comprehensive metabolic panel     Status: Abnormal   Collection Time: 02/15/23  6:08 PM  Result Value Ref Range   Sodium 139 135 - 145 mmol/L   Potassium 3.9 3.5 - 5.1 mmol/L   Chloride 105 98 - 111 mmol/L   CO2 24 22 - 32 mmol/L   Glucose, Bld 113 (H) 70 - 99 mg/dL    Comment: Glucose reference range applies only to samples taken after fasting for at least 8 hours.   BUN 9 6 - 20 mg/dL   Creatinine, Ser 8.29 0.61 - 1.24 mg/dL   Calcium 9.0 8.9 - 56.2 mg/dL   Total Protein 6.9 6.5 - 8.1 g/dL   Albumin 4.2 3.5 - 5.0 g/dL   AST 34 15 - 41 U/L   ALT 34 0 - 44 U/L   Alkaline Phosphatase 39 38 - 126 U/L   Total Bilirubin 0.8 0.3 - 1.2 mg/dL    GFR, Estimated >13 >08 mL/min    Comment: (NOTE) Calculated using the CKD-EPI Creatinine Equation (2021)    Anion gap 10 5 - 15    Comment: Performed at [facility], [address], [place], [place] [address]  Ethanol  Status: None   Collection Time: 02/15/23  6:08 PM  Result Value Ref Range   Alcohol, Ethyl (B) <10 <10 mg/dL    Comment: (NOTE) Lowest detectable limit for serum alcohol is 10 mg/dL.  For medical purposes only. Performed at [facility], [address] [place], [place] [address]   Urine rapid drug screen (hosp performed)     Status: None   Collection Time: 02/15/23  6:08 PM  Result Value Ref Range   Opiates NONE DETECTED NONE DETECTED   Cocaine NONE DETECTED NONE DETECTED   Benzodiazepines NONE DETECTED NONE DETECTED   Amphetamines NONE DETECTED NONE DETECTED   Tetrahydrocannabinol NONE DETECTED NONE DETECTED   Barbiturates NONE DETECTED NONE DETECTED    Comment: (NOTE) DRUG SCREEN FOR MEDICAL PURPOSES ONLY.  IF CONFIRMATION IS NEEDED FOR ANY PURPOSE, NOTIFY LAB WITHIN 5 DAYS.  LOWEST DETECTABLE LIMITS FOR URINE DRUG SCREEN Drug Class                     Cutoff (ng/mL) Amphetamine and metabolites    1000 Barbiturate and metabolites    200 Benzodiazepine                 200 Opiates and metabolites        300 Cocaine and metabolites        300 THC                            50 Performed at [facility], [address], [place], [place] [address]   CBC with Differential/Platelet     Status: None   Collection Time: 02/15/23  6:08 PM  Result Value Ref Range   WBC 5.3 4.0 - 10.5 K/uL   RBC 5.24 4.22 - 5.81 MIL/uL   Hemoglobin 15.3 13.0 - 17.0 g/dL   HCT 09.8 11.9 - 14.7 %   MCV 84.9 80.0 - 100.0 fL   MCH 29.2 26.0 - 34.0 pg   MCHC 34.4 30.0 - 36.0 g/dL   RDW 82.9 56.2 - 13.0 %   Platelets 292 150 - 400 K/uL   nRBC 0.0 0.0 - 0.2 %   Neutrophils Relative % 32 %   Neutro Abs 1.7 1.7 - 7.7  K/uL   Lymphocytes Relative 56 %   Lymphs Abs 3.0 0.7 - 4.0 K/uL   Monocytes Relative 7 %   Monocytes Absolute 0.4 0.1 - 1.0 K/uL   Eosinophils Relative 4 %   Eosinophils Absolute 0.2 0.0 - 0.5 K/uL   Basophils Relative 1 %   Basophils Absolute 0.0 0.0 - 0.1 K/uL   Immature Granulocytes 0 %   Abs Immature Granulocytes 0.02 0.00 - 0.07 K/uL    Comment: Performed at [facility], [address], [place], [place] [address]    Current Facility-Administered Medications  Medication Dose Route Frequency Provider Last Rate Last Admin   divalproex (DEPAKOTE) DR tablet 250 mg  250 mg Oral Q12H Sahiti Joswick C, NP       hydrOXYzine (ATARAX) tablet 25 mg  25 mg Oral TID PRN Dahlia Byes C, NP       OLANZapine zydis (ZYPREXA) disintegrating tablet 5 mg  5 mg Oral BID Dahlia Byes C, NP       Current Outpatient Medications  Medication Sig Dispense Refill   Azelastine-Fluticasone (DYMISTA) 137-50 MCG/ACT SUSP Place into the nose.     cetirizine (ZYRTEC) 10 MG tablet  Take 10 mg by mouth daily.     hydrOXYzine (VISTARIL) 25 MG capsule Take 1 capsule (25 mg total) by mouth 3 (three) times daily as needed for anxiety (agitation, sleep). 30 capsule 0   OLANZapine (ZYPREXA) 5 MG tablet Take 1 tablet (5 mg total) by mouth 2 (two) times daily. 30 tablet 2    Musculoskeletal: Strength & Muscle Tone: within normal limits Gait & Station: normal Patient leans: Front   Psychiatric Specialty Exam: Presentation  General Appearance:  Neat; Well Groomed  Eye Contact: Good  Speech: Slow  Speech Volume: Decreased  Handedness: Right   Mood and Affect  Mood: Anxious; Labile  Affect: Congruent   Thought Process  Thought Processes: Coherent  Descriptions of Associations:Intact  Orientation:Partial  Thought Content:Logical  History of Schizophrenia/Schizoaffective disorder:No data recorded Duration of Psychotic Symptoms:No data  recorded Hallucinations:Hallucinations: None  Ideas of Reference:None  Suicidal Thoughts:Suicidal Thoughts: No  Homicidal Thoughts:Homicidal Thoughts: No   Sensorium  Memory: Immediate Fair; Recent Fair; Remote Fair  Judgment: Poor  Insight: Poor   Executive Functions  Concentration: Fair  Attention Span: Fair  Recall: Poor  Fund of Knowledge: Poor  Language: Good   Psychomotor Activity  Psychomotor Activity: Psychomotor Activity: Normal   Assets  Assets: Manufacturing systems engineer; Housing; Social Support    Sleep  Sleep: Sleep: Good   Physical Exam: Physical Exam Vitals and nursing note reviewed.  Constitutional:      Appearance: Normal appearance.  HENT:     Nose: Nose normal.  Cardiovascular:     Rate and Rhythm: Tachycardia present.  Pulmonary:     Effort: Pulmonary effort is normal.  Musculoskeletal:        General: Normal range of motion.  Skin:    General: Skin is dry.  Neurological:     Mental Status: He is alert and oriented to person, place, and time.  Psychiatric:        Attention and Perception: Attention and perception normal.        Mood and Affect: Mood is anxious. Affect is labile.        Speech: Speech normal.        Behavior: Behavior is cooperative.        Thought Content: Thought content normal.        Judgment: Judgment is impulsive.    Review of Systems  Constitutional: Negative.   HENT: Negative.    Eyes: Negative.   Respiratory: Negative.    Cardiovascular: Negative.   Gastrointestinal: Negative.   Genitourinary: Negative.   Musculoskeletal: Negative.   Skin: Negative.   Neurological: Negative.   Endo/Heme/Allergies: Negative.   Psychiatric/Behavioral:  The patient is nervous/anxious.    Blood pressure (!) 135/96, pulse 95, temperature 97.9 F (36.6 C), temperature source Oral, resp. rate 18, height 5' (1.524 m), weight 103.1 kg, SpO2 100%. Body mass index is 44.41 kg/m.  Medical Decision Making: We  will start Depakote for Mood stabilization , Olanzapine fore mood and anger management.  Hydroxyzine for anxiety.  We will reevaluate in am.  Patient does not meet criteria for inpatient Psychiatry hospitalization  Problem 1: Aggressive behavior  Problem 2: Outburst of explosive behavior.  Disposition:  Reevaluate in am  Earney Navy, NP-PMHNP-BC 02/15/2023 7:46 PM

## 2023-02-15 NOTE — ED Triage Notes (Signed)
Pt mother brought pt for mood swings and wants pt committed. Pt tore up clock and tv at home. Hx of schizophrenia and bipolar. Pt is taking Lybalvi and hydroxyzine and reports no relief in symptoms. Denies SI/HI.

## 2023-02-16 DIAGNOSIS — R4689 Other symptoms and signs involving appearance and behavior: Secondary | ICD-10-CM | POA: Diagnosis not present

## 2023-02-16 MED ORDER — HYDROXYZINE HCL 25 MG PO TABS: 25.0000 mg | ORAL_TABLET | Freq: Three times a day (TID) | ORAL | 0 refills | Status: AC | PRN

## 2023-02-16 MED ORDER — OLANZAPINE 5 MG PO TBDP: 5.0000 mg | ORAL_TABLET | Freq: Two times a day (BID) | ORAL | 0 refills | Status: AC

## 2023-02-16 MED ORDER — CLONAZEPAM 0.5 MG PO TABS: 0.5000 mg | ORAL_TABLET | Freq: Two times a day (BID) | ORAL | 0 refills | Status: AC | PRN

## 2023-02-16 MED ORDER — DIVALPROEX SODIUM 250 MG PO DR TAB: 250.0000 mg | DELAYED_RELEASE_TABLET | Freq: Two times a day (BID) | ORAL | 0 refills | Status: AC

## 2023-02-16 NOTE — ED Notes (Signed)
[facility] called pts mother to inform her that pt would be discharged and ready to be picked up within the hour. Pts mother said that she was not going to come pick him up because he needs more help. She feels that something more should be done for pt. [facility] had pts mother speak with the provider who explained that pt was given medication, he has acted appropriately since he has been medicated and should continue with medication after he is discharged. Pts mother still refused to pick pt up.   Jacquelynn Cree, [facility]    02/16/23

## 2023-02-16 NOTE — ED Notes (Deleted)
[facility] called pts mother to inform her that pt would be discharged and ready to be picked up within the hour. Pts mother said that she was not going to come pick him up because he needs more help. She feels that something more should be done for pt. [facility] had pts mother speak with the provider who explained that pt was given medication, he has acted appropriately since he has been medicated and should continue with medication after he is discharged.  Pts mother still refused to pick pt up. The provider explained that is pt was not picked up we would need to call CPS and file a report. Pts mother said she still was not going to pick pt up and said "do what you gotta do".  Jacquelynn Cree, [facility]   02/16/23

## 2023-02-16 NOTE — Progress Notes (Signed)
This CSW spoke to the patient's mom about the concerns of patient's DC. AT this time the mom has refused to come get the son. Patient's mom stated that they have been dealing with the patient's behavior since June of this year. The patient's mom also reports that the son does not behave well in her care and her and his grandmother have tried everything. This CSW asked the mom about interventions that she already had in place. Mom reports working with Dale Eldorado at this time with Care Coordinator [place] at [phone]. The mom continued to refuse to come get the patient. At this time CSW has reached out to leadership as well as the patients CC to arrange a family meeting to discuss proper interventions within the community to assist the family. All parties have agreed to a meeting on Thursday at 4 pm. This CSW has provided all parties with a meeting link. TOC will continue to follow and update after the meeting on 02/18/23 at 4 PM.

## 2023-02-16 NOTE — ED Notes (Signed)
[facility] called [place] [phone]), the pts care manager see if pt has an out patient psych provider. Pt received treatment from [facility]. The care manager reports that since this past July pts behavior has worsened and the medication he is currently taking is not working. Rot[place]ka would like to pt to go inpatient though he may not fulfill criteria and she continues to work on finding a group home placement for pt.   Jacquelynn Cree, [facility]  02/16/23

## 2023-02-16 NOTE — Progress Notes (Signed)
Per Earney Navy, NP-PMHNP-BC  pt has been psych cleared. TOC will assist with discharge needs. This CSW will now remove pt from the [facility] shift report.   Maryjean Ka, MSW, [facility] 02/16/2023 11:35 PM

## 2023-02-16 NOTE — Discharge Summary (Cosign Needed Addendum)
[facility] Psych ED Discharge  02/16/2023 2:43 PM Cory Freeman  MRN:  [id number]  Principal Problem: Aggression Discharge Diagnoses: Principal Problem:   Aggression Active Problems:   Outbursts of explosive behavior  Clinical Impression:  Final diagnoses:  Aggressive behavior   Subjective: Cory Freeman is a 22 y.o. male patient admitted with hx of intellectual disability, aggression and   outburst of explosive anger was brought in to the ER by his mother and grandmother this evening for explosive anger and gression where he tore up wall clock and broke TV.   Patient was seen calm and cooperative.  Patient has had no anger outburst or any concerning behavior.  He stays in his ER room and watches TV.  He is eating and accepting Medications as his mom said.  Patient states he does not know why he destroys properties.  He promises not to do that again.  He promises to apologies to his mother.  Patient report good sleep and appetite.  Patient has not had an official Psychological test done and nobody knows what his IQ is.  Provider spoke with his SW Rotekka who states that she is looking into arranging a Psychological test for patient.  She also states that after the test she and patient's mother will start looking for group home for him.   Patient, as stated above has been calm and cooperative.  He denies SI/HI/AVH.  Patient is Psychiatrically cleared.  Patient is going home on Olanzapine, Hydroxyzine, Depakote and possible low dose Ativan. Mother, Cory Freeman,  [place] was called to come pick her son up.  She refused to come take patient stating patient need inpatient Psychiatry hospitalization. Mother was informed that we made medication changes that will benefit patient but she refused.  Patient is Psychiatrically cleared pending when Mother can take him or group home placement arranged.  ED Assessment Time Calculation: Start Time: 1408 Stop Time: 1436 Total Time in Minutes (Assessment Completion):  28   Past Psychiatric History: see initial Psychiatry evaluation note  Past Medical History:  Past Medical History:  Diagnosis Date   Global developmental delay    Seasonal allergies    Speech/language delay    History reviewed. No pertinent surgical history. Family History:  Family History  Problem Relation Age of Onset   Hypertension Mother    Obesity Mother    Anemia Mother    Diabetes Mother    Diabetes Maternal Grandmother    Hypertension Maternal Grandmother    Family Psychiatric  History: see initial Psychiatry evaluation note Social History:  Social History   Substance and Sexual Activity  Alcohol Use No     Social History   Substance and Sexual Activity  Drug Use No    Social History   Socioeconomic History   Marital status: Single    Spouse name: Not on file   Number of children: Not on file   Years of education: Not on file   Highest education level: Not on file  Occupational History   Not on file  Tobacco Use   Smoking status: Never   Smokeless tobacco: Never  Substance and Sexual Activity   Alcohol use: No   Drug use: No   Sexual activity: Not on file  Other Topics Concern   Not on file  Social History Narrative   Is in 7th grade at [employer], special education. Receives speech and occupational therapy 3 days per week.    Lives with mom and grandma   Social Determinants  of Health   Financial Resource Strain: Not on file  Food Insecurity: Not on file  Transportation Needs: Not on file  Physical Activity: Not on file  Stress: Not on file  Social Connections: Not on file    Tobacco Cessation:  N/A, patient does not currently use tobacco products  Current Medications: Current Facility-Administered Medications  Medication Dose Route Frequency Provider Last Rate Last Admin   divalproex (DEPAKOTE) DR tablet 250 mg  250 mg Oral Q12H Kynzi Levay C, NP   250 mg at 02/16/23 0905   hydrOXYzine (ATARAX) tablet 25 mg  25 mg Oral  TID PRN Earney Navy, NP       OLANZapine zydis (ZYPREXA) disintegrating tablet 5 mg  5 mg Oral BID Dahlia Byes C, NP   5 mg at 02/16/23 0905   Current Outpatient Medications  Medication Sig Dispense Refill   cetirizine (ZYRTEC) 10 MG tablet Take 10 mg by mouth daily.     clonazePAM (KLONOPIN) 0.5 MG tablet Take 1 tablet (0.5 mg total) by mouth 2 (two) times daily as needed for up to 15 days for anxiety. 30 tablet 0   divalproex (DEPAKOTE) 250 MG DR tablet Take 1 tablet (250 mg total) by mouth every 12 (twelve) hours. 60 tablet 0   hydrOXYzine (ATARAX) 25 MG tablet Take 1 tablet (25 mg total) by mouth 3 (three) times daily as needed for anxiety. 30 tablet 0   OLANZapine zydis (ZYPREXA) 5 MG disintegrating tablet Take 1 tablet (5 mg total) by mouth 2 (two) times daily. 60 tablet 0   PTA Medications: (Not in a hospital admission)   [place] Scale:  Flowsheet Row ED from 02/15/2023 in [facility] Emergency Department at [facility] ED from 02/08/2023 in [facility] ED from 11/19/2022 in [facility] Emergency Department at [facility]  C-SSRS RISK CATEGORY No Risk No Risk No Risk       Musculoskeletal: Strength & Muscle Tone: within normal limits Gait & Station: normal Patient leans: Front  Psychiatric Specialty Exam: Presentation  General Appearance:  Casual; Neat; Well Groomed  Eye Contact: Good  Speech: Clear and Coherent; Slow  Speech Volume: Decreased  Handedness: Right   Mood and Affect  Mood: Euthymic  Affect: Congruent   Thought Process  Thought Processes: Coherent; Goal Directed  Descriptions of Associations:Intact  Orientation:Partial  Thought Content:Logical  History of Schizophrenia/Schizoaffective disorder:No data recorded Duration of Psychotic Symptoms:No data recorded Hallucinations:Hallucinations: None  Ideas of Reference:None  Suicidal Thoughts:Suicidal Thoughts: No  Homicidal  Thoughts:Homicidal Thoughts: No   Sensorium  Memory: Immediate Good; Recent Good; Remote Fair  Judgment: Fair  Insight: Fair   Art therapist  Concentration: Good  Attention Span: Good  Recall: Fair  Fund of Knowledge: Fair  Language: Fair   Psychomotor Activity  Psychomotor Activity: Psychomotor Activity: Normal   Assets  Assets: Communication Skills; Desire for Improvement; Housing; Physical Health   Sleep  Sleep: Sleep: Good    Physical Exam: Physical Exam Vitals and nursing note reviewed.  Constitutional:      Appearance: Normal appearance.  HENT:     Nose: Nose normal.  Cardiovascular:     Rate and Rhythm: Normal rate and regular rhythm.  Pulmonary:     Effort: Pulmonary effort is normal.  Musculoskeletal:        General: Normal range of motion.     Cervical back: Normal range of motion.  Skin:    General: Skin is dry.  Neurological:  Mental Status: He is alert and oriented to person, place, and time.  Psychiatric:        Attention and Perception: Attention and perception normal.        Mood and Affect: Mood normal.        Speech: Speech normal.        Behavior: Behavior normal. Behavior is cooperative.        Thought Content: Thought content normal.        Cognition and Memory: Cognition and memory normal.        Judgment: Judgment is impulsive.    Review of Systems  Constitutional: Negative.   HENT: Negative.    Eyes: Negative.   Respiratory: Negative.    Cardiovascular: Negative.   Gastrointestinal: Negative.   Genitourinary: Negative.   Musculoskeletal: Negative.   Skin: Negative.   Neurological: Negative.   Endo/Heme/Allergies: Negative.   Psychiatric/Behavioral: Negative.     Blood pressure (!) 115/92, pulse 75, temperature 97.8 F (36.6 C), temperature source Oral, resp. rate 18, height 5' (1.524 m), weight 103.1 kg, SpO2 100%. Body mass index is 44.41 kg/m.   Demographic Factors:  Male, Adolescent or  young adult, Low socioeconomic status, and Unemployed  Loss Factors: NA  Historical Factors: Impulsivity  Risk Reduction Factors:   Living with another person, especially a relative and Positive social support  Continued Clinical Symptoms:  Impulsive behavior   Cognitive Features That Contribute To Risk:  None    Suicide Risk:  Minimal: No identifiable suicidal ideation.  Patients presenting with no risk factors but with morbid ruminations; may be classified as minimal risk based on the severity of the depressive symptoms    Plan Of Care/Follow-up recommendations:  Activity:  as tolerated Diet:  Regular   Medical Decision Making: Patient remains calm and cooperative.  Patient has not had  any anger or negative behavior.  Patient is compliant with her medications and he is Psychiatrically cleared.   Mother is refusing to take him home.  TOC is in to assist with disposition.  Prescriptions for Olanzapine, Klonopin, Hydroxyzine and Depakote given.  Mother to take patient to PCP in 7 days for Depakote level.   Disposition: Psychiatrically cleared. Earney Navy, NP-PMHNP-BC 02/16/2023, 2:43 PM

## 2023-02-17 NOTE — ED Provider Notes (Signed)
Emergency Medicine Observation Re-evaluation Note  Cory Freeman is a 22 y.o. male, seen on rounds today.  Pt initially presented to the ED for complaints of Psychiatric Evaluation Currently, the patient is calm and cooperative resting in room.  No specific physical complaints this morning  Physical Exam  BP (!) 115/92 (BP Location: Right Arm)   Pulse 75   Temp 97.8 F (36.6 C) (Oral)   Resp 18   Ht 5' (1.524 m)   Wt 103.1 kg   SpO2 100%   BMI 44.41 kg/m  Physical Exam General: Well-appearing Cardiac: Normal heart rate Lungs: Normal work of breathing Psych: Calm and cooperative  ED Course / MDM  EKG:EKG Interpretation Date/Time:  Monday February 15 2023 17:59:42 EDT Ventricular Rate:  103 PR Interval:  130 QRS Duration:  99 QT Interval:  343 QTC Calculation: 449 R Axis:   43  Text Interpretation: Sinus tachycardia Confirmed by Fulton Reek [phone]) on 02/15/2023 7:01:32 PM  I have reviewed the labs performed to date as well as medications administered while in observation.  Recent changes in the last 24 hours include cleared by psych.  Plan  Current plan is for family meeting with social work on 02/18/2023 as family does not want to take patient back home.    Cory Spikes, DO 02/17/23 0800

## 2023-02-18 NOTE — Progress Notes (Signed)
CM, ED CSW, and psych provider met with patient's mother, grandmother, and care coordinators from [facility].  Discussed events of ED stay, medication adjustments provided, and calm behaviors through out stay.  Family expresses a lot of frustration with difficulty navigating mental health system and accessing necessary resources to help patient, along with worries and fears of his previous behavior outbursts.  Care coordinators with [facility] expressed that they are working on day program supports in the community and exploring placement options, however they need a psychological exam to help support these placements and have been unable to secure this evaluation as all agencies contacted have an extensive wait list, some as long at 8 months.  Family does report that the last psychological was completed in 2020, and they have requested a copy of this, which is expected to arrive in 10 days and will provide this to [facility].  CC reports this document can be utilized once obtained.    Mother is in agreement to pick up patient from ED today at 6 pm, she requests enough medication supply to last until patient can follow up with his psychiatrist at [facility] in 4 weeks.  Provider is aware of this request, preferred pharmacy is [facility] on [address]  [place] [facility] information discussed, though family reports they have not found this resource helpful.  Additionally discussed that mental health treatment can take time, and that behaviors can occur through out patient's life with periods that are better and then worse, so regular follow up is important with the patients psychiatrist so that continued support and management can be provided.    Bedside RN and EDP provided with updates regarding outcome of meeting. Plan at this time is to discharge patient home with mother when she arrives. No further TOC needs have been identified at this time.

## 2023-02-18 NOTE — Discharge Instructions (Addendum)
You were seen in the emergency department for your aggression.  Your workup showed normal labs and psychiatry evaluated you and recommended some medication changes.  They have sent new prescriptions to your pharmacy.  You should take these as prescribed.  You should follow-up with your outpatient primary doctor or psychiatrist for follow-up of your symptoms.  You should return to the emergency department or the behavioral health urgent care for further mental health crisis needs, if you do have thoughts or risk of hurting yourself or others or if you have any other new or concerning symptoms.

## 2023-02-18 NOTE — ED Provider Notes (Signed)
Spoke with Sports administrator at [facility] on [address] in [place], [place] and prescribed 30 day supply of Depakote DR 250 mg p.o., hydroxyzine 25 mg p.o., and Zyprexa 5 mg twice daily p.o.  This provider spoke on a video conference with CM, ED CSW, and patient's mother, grandmother, and care coordinators from [facility]. This provider discussed medication adjustments provided, and calm behaviors through out stay.

## 2023-02-18 NOTE — ED Provider Notes (Signed)
Emergency Medicine Observation Re-evaluation Note  Kylil J Shaneyfelt is a 22 y.o. male, seen on rounds today.  Pt initially presented to the ED for complaints of Psychiatric Evaluation Currently, the patient is nad.  Physical Exam  BP 111/66 (BP Location: Left Arm)   Pulse 72   Temp 98 F (36.7 C) (Oral)   Resp 16   Ht 1.524 m (5')   Wt 103.1 kg   SpO2 100%   BMI 44.41 kg/m  Physical Exam General: nad Cardiac: regular rate Lungs: normal effort Psych: deferred, sleeping  ED Course / MDM  EKG:EKG Interpretation Date/Time:  Monday February 15 2023 17:59:42 EDT Ventricular Rate:  103 PR Interval:  130 QRS Duration:  99 QT Interval:  343 QTC Calculation: 449 R Axis:   43  Text Interpretation: Sinus tachycardia Confirmed by Fulton Reek [phone]) on 02/15/2023 7:01:32 PM  I have reviewed the labs performed to date as well as medications administered while in observation.  Recent changes in the last 24 hours include cleared bv psych.  Family not comfortable taking pt home..  Plan  Current plan is for social work to coordinate dc home to home as family not willing to pick pt up as of yesterday    Linwood Dibbles, MD 02/18/23 1005

## 2023-02-18 NOTE — Progress Notes (Signed)
CSW sat in the family meeting with leader Riki Rusk NP and Care Coordinators from [facility]. The patient's mother as well grandma was on the meeting. The mom had many concerns about the patients behavior at this time the [facility] team as well TTS team has addressed the family concerns. Delcie Roch is patient's CC is currently working on placement as well day programs for the patient. The mother has agreed to come and get the patient to return home. At this time TOC will sign off.

## 2023-02-18 NOTE — ED Notes (Signed)
Patient is calm and cooperative.

## 2023-02-18 NOTE — Progress Notes (Signed)
Case discussed on [facility] Conference with ED Director, EDP, Psych NP, TOC supervisor and [facility] Director.   Plan is for family meeting at 4pm.
